# Patient Record
Sex: Male | Born: 2000 | Hispanic: No | Marital: Single
Health system: Southern US, Community
[De-identification: ages and names within clinical notes are randomized; demographics above are authoritative.]

## PROBLEM LIST (undated history)

## (undated) DIAGNOSIS — H547 Unspecified visual loss: Secondary | ICD-10-CM

## (undated) DIAGNOSIS — T7840XA Allergy, unspecified, initial encounter: Secondary | ICD-10-CM

## (undated) HISTORY — DX: Allergy, unspecified, initial encounter: T78.40XA

## (undated) HISTORY — DX: Unspecified visual loss: H54.7

---
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
## 2019-02-20 ENCOUNTER — Other Ambulatory Visit: Payer: Self-pay

## 2019-02-20 ENCOUNTER — Emergency Department (HOSPITAL_COMMUNITY)
Admission: EM | Admit: 2019-02-20 | Discharge: 2019-02-20 | Discharge status: Against Medical Advice | Payer: 59 | Attending: Emergency Medicine | Admitting: Emergency Medicine

## 2019-02-20 ENCOUNTER — Encounter (HOSPITAL_COMMUNITY): Payer: Self-pay

## 2019-02-20 DIAGNOSIS — Z5321 Procedure and treatment not carried out due to patient leaving prior to being seen by health care provider: Secondary | ICD-10-CM | POA: Insufficient documentation

## 2019-02-20 DIAGNOSIS — Z041 Encounter for examination and observation following transport accident: Secondary | ICD-10-CM | POA: Diagnosis present
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
## 2019-02-21 ENCOUNTER — Ambulatory Visit: Payer: 59 | Admitting: Medical

## 2019-02-21 ENCOUNTER — Encounter: Payer: Self-pay | Admitting: Medical

## 2019-02-21 VITALS — BP 120/76 | HR 80 | Temp 98.6°F | Resp 16 | Ht 70.0 in | Wt 152.4 lb

## 2019-02-21 DIAGNOSIS — M545 Low back pain, unspecified: Secondary | ICD-10-CM

## 2019-02-21 DIAGNOSIS — R51 Headache: Secondary | ICD-10-CM | POA: Diagnosis not present

## 2019-02-21 DIAGNOSIS — S60519A Abrasion of unspecified hand, initial encounter: Secondary | ICD-10-CM

## 2019-02-21 DIAGNOSIS — M542 Cervicalgia: Secondary | ICD-10-CM | POA: Diagnosis not present

## 2019-02-21 DIAGNOSIS — R519 Headache, unspecified: Secondary | ICD-10-CM | POA: Insufficient documentation

## 2019-02-21 MED ORDER — HYDROCODONE-ACETAMINOPHEN 5-325 MG PO TABS: 1.0000 | ORAL_TABLET | Freq: Four times a day (QID) | ORAL | 0 refills | Status: DC | PRN
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
## 2019-02-28 ENCOUNTER — Other Ambulatory Visit: Payer: Self-pay

## 2019-02-28 ENCOUNTER — Ambulatory Visit
Admission: RE | Admit: 2019-02-28 | Discharge: 2019-02-28 | Disposition: A | Discharge status: Home / Self Care | Payer: Self-pay | Source: Ambulatory Visit | Attending: Medical | Admitting: Medical

## 2019-02-28 DIAGNOSIS — R519 Headache, unspecified: Secondary | ICD-10-CM

## 2019-02-28 DIAGNOSIS — M542 Cervicalgia: Secondary | ICD-10-CM

## 2019-06-15 ENCOUNTER — Other Ambulatory Visit: Payer: Self-pay

## 2019-06-15 DIAGNOSIS — Z20822 Contact with and (suspected) exposure to covid-19: Secondary | ICD-10-CM

## 2019-06-18 LAB — NOVEL CORONAVIRUS, NAA: SARS-CoV-2, NAA: NOT DETECTED
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
## 2024-08-05 ENCOUNTER — Other Ambulatory Visit: Payer: Self-pay

## 2024-08-05 DIAGNOSIS — Z23 Encounter for immunization: Secondary | ICD-10-CM | POA: Diagnosis not present

## 2024-08-05 DIAGNOSIS — S0993XA Unspecified injury of face, initial encounter: Secondary | ICD-10-CM | POA: Diagnosis present

## 2024-08-05 DIAGNOSIS — S01511A Laceration without foreign body of lip, initial encounter: Secondary | ICD-10-CM | POA: Diagnosis not present

## 2024-08-05 DIAGNOSIS — Y92410 Unspecified street and highway as the place of occurrence of the external cause: Secondary | ICD-10-CM | POA: Insufficient documentation
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
## 2024-08-06 ENCOUNTER — Emergency Department (HOSPITAL_BASED_OUTPATIENT_CLINIC_OR_DEPARTMENT_OTHER)
Admission: EM | Admit: 2024-08-06 | Discharge: 2024-08-06 | Disposition: A | Discharge status: Home / Self Care | Payer: Self-pay | Attending: Emergency Medicine | Admitting: Emergency Medicine

## 2024-08-06 DIAGNOSIS — S01511A Laceration without foreign body of lip, initial encounter: Secondary | ICD-10-CM

## 2024-08-06 MED ORDER — LIDOCAINE-EPINEPHRINE (PF) 2 %-1:200000 IJ SOLN
10.0000 mL | Freq: Once | INTRAMUSCULAR | Status: AC
  Administered 2024-08-06: 10 mL
  Filled 2024-08-06: qty 20

## 2024-08-06 MED ORDER — TETANUS-DIPHTH-ACELL PERTUSSIS 5-2-15.5 LF-MCG/0.5 IM SUSP
0.5000 mL | Freq: Once | INTRAMUSCULAR | Status: AC
  Administered 2024-08-06: 0.5 mL via INTRAMUSCULAR
  Filled 2024-08-06: qty 0.5

## 2024-08-06 NOTE — ED Notes (Signed)
 Patient transferred from waiting room to ED treatment room. Assuming pt care at this time.
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
## 2024-08-08 ENCOUNTER — Encounter (HOSPITAL_BASED_OUTPATIENT_CLINIC_OR_DEPARTMENT_OTHER): Payer: Self-pay | Admitting: Emergency Medicine

## 2024-08-08 ENCOUNTER — Emergency Department (HOSPITAL_BASED_OUTPATIENT_CLINIC_OR_DEPARTMENT_OTHER)
Admission: EM | Admit: 2024-08-08 | Discharge: 2024-08-08 | Disposition: A | Discharge status: Home / Self Care | Payer: Self-pay | Attending: Emergency Medicine | Admitting: Emergency Medicine

## 2024-08-08 ENCOUNTER — Other Ambulatory Visit: Payer: Self-pay

## 2024-08-08 DIAGNOSIS — Z5189 Encounter for other specified aftercare: Secondary | ICD-10-CM

## 2024-08-08 DIAGNOSIS — L089 Local infection of the skin and subcutaneous tissue, unspecified: Secondary | ICD-10-CM

## 2024-08-08 DIAGNOSIS — X58XXXD Exposure to other specified factors, subsequent encounter: Secondary | ICD-10-CM | POA: Insufficient documentation

## 2024-08-08 DIAGNOSIS — Z4800 Encounter for change or removal of nonsurgical wound dressing: Secondary | ICD-10-CM | POA: Insufficient documentation

## 2024-08-08 DIAGNOSIS — S01511D Laceration without foreign body of lip, subsequent encounter: Secondary | ICD-10-CM | POA: Insufficient documentation

## 2024-08-08 MED ORDER — AMOXICILLIN-POT CLAVULANATE 875-125 MG PO TABS: 1.0000 | ORAL_TABLET | Freq: Two times a day (BID) | ORAL | 0 refills | Status: AC

## 2024-08-08 MED ORDER — OXYCODONE HCL 5 MG PO TABS: 5.0000 mg | ORAL_TABLET | Freq: Four times a day (QID) | ORAL | 0 refills | Status: AC | PRN

## 2024-08-08 MED ORDER — OXYCODONE HCL 5 MG PO TABS
5.0000 mg | ORAL_TABLET | Freq: Once | ORAL | Status: AC
  Administered 2024-08-08: 5 mg via ORAL
  Filled 2024-08-08: qty 1

## 2024-08-08 MED ORDER — IBUPROFEN 800 MG PO TABS
800.0000 mg | ORAL_TABLET | Freq: Once | ORAL | Status: AC
  Administered 2024-08-08: 800 mg via ORAL
  Filled 2024-08-08: qty 1

## 2024-08-08 MED ORDER — AMOXICILLIN-POT CLAVULANATE 875-125 MG PO TABS
1.0000 | ORAL_TABLET | Freq: Once | ORAL | Status: AC
  Administered 2024-08-08: 1 via ORAL
  Filled 2024-08-08: qty 1

## 2024-08-08 NOTE — ED Triage Notes (Signed)
 Pt c/o increased pain and swelling to lower lip after lac 2 days ago. Inner aspect of lip appears to have white exudate collected near the gumline. Pain rated 10/10.
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
## 2024-08-08 NOTE — Discharge Instructions (Signed)
 It was a pleasure taking care of you today.  Based on your history, as well as physical exam I feel you are safe for discharge.  Today it does appear that your lip laceration may have become infected.  Because of this I have sent a prescription for an antibiotic called Augmentin .  Please pick it up from the pharmacy and take as prescribed.  For help with pain I recommend alternating Tylenol  and ibuprofen  every 3-4 hours.  You may take up to 1000 mg of Tylenol  in a single dose, please do not exceed 4000 mg in a single day.  You may take 6 to 800 mg of ibuprofen  in a single dose.  Also recommend ice to help with swelling.  Please keep the area as clean as possible.  Your sutures should dissolve on their own.  If you experience fever, chills, issues swallowing, issues breathing, spreading of infection, worsening swelling, stiff neck, or other concerning symptom please return to the emergency department or seek further medical care.  I have also sent a prescription for a narcotic pain medication called oxycodone , you may pick it up from the pharmacy and take as prescribed as needed for breakthrough pain.  Recommend follow-up with your primary care provider within the next 3 to 4 days to make sure that wound is improving.

## 2024-08-08 NOTE — ED Notes (Signed)
